# Patient Record
Sex: Male | Born: 1975 | Race: White | Hispanic: No | Marital: Married | Smoking: Current every day smoker
Health system: Southern US, Community
[De-identification: ages and names within clinical notes are randomized; demographics above are authoritative.]

## PROBLEM LIST (undated history)

## (undated) DIAGNOSIS — F419 Anxiety disorder, unspecified: Secondary | ICD-10-CM

## (undated) HISTORY — PX: APPENDECTOMY: SHX54

---

## 2007-03-06 ENCOUNTER — Emergency Department: Payer: Self-pay | Admitting: Emergency Medicine

## 2008-12-10 ENCOUNTER — Emergency Department: Payer: Self-pay | Admitting: Emergency Medicine

## 2013-10-12 ENCOUNTER — Ambulatory Visit: Payer: Self-pay | Admitting: Physician Assistant

## 2015-01-28 DIAGNOSIS — F419 Anxiety disorder, unspecified: Secondary | ICD-10-CM | POA: Insufficient documentation

## 2015-10-17 ENCOUNTER — Ambulatory Visit (INDEPENDENT_AMBULATORY_CARE_PROVIDER_SITE_OTHER): Payer: BLUE CROSS/BLUE SHIELD

## 2015-10-17 ENCOUNTER — Encounter: Payer: Self-pay | Admitting: Emergency Medicine

## 2015-10-17 ENCOUNTER — Ambulatory Visit
Admission: EM | Admit: 2015-10-17 | Discharge: 2015-10-17 | Disposition: A | Discharge status: Home / Self Care | Payer: BLUE CROSS/BLUE SHIELD | Attending: Family Medicine | Admitting: Family Medicine

## 2015-10-17 DIAGNOSIS — M62830 Muscle spasm of back: Secondary | ICD-10-CM | POA: Diagnosis not present

## 2015-10-17 HISTORY — DX: Anxiety disorder, unspecified: F41.9

## 2015-10-17 MED ORDER — KETOROLAC TROMETHAMINE 60 MG/2ML IM SOLN
60.0000 mg | Freq: Once | INTRAMUSCULAR | Status: AC
  Administered 2015-10-17: 60 mg via INTRAMUSCULAR

## 2015-10-17 MED ORDER — HYDROCODONE-ACETAMINOPHEN 10-325 MG PO TABS: 1.0000 | ORAL_TABLET | Freq: Four times a day (QID) | ORAL | Status: AC | PRN

## 2015-10-17 MED ORDER — ORPHENADRINE CITRATE ER 100 MG PO TB12: 100.0000 mg | ORAL_TABLET | Freq: Two times a day (BID) | ORAL | Status: AC

## 2015-10-17 MED ORDER — MELOXICAM 15 MG PO TABS: 15.0000 mg | ORAL_TABLET | Freq: Every day | ORAL | Status: AC
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
## 2015-10-17 NOTE — ED Notes (Signed)
Pt denies hitting head, no burns. Pt also reports numbness and tingling R toes.

## 2015-10-17 NOTE — Discharge Instructions (Signed)
Back Exercises If you have pain in your back, do these exercises 2-3 times each day or as told by your doctor. When the pain goes away, do the exercises once each day, but repeat the steps more times for each exercise (do more repetitions). If you do not have pain in your back, do these exercises once each day or as told by your doctor. EXERCISES Single Knee to Chest Do these steps 3-5 times in a row for each leg:  Lie on your back on a firm bed or the floor with your legs stretched out.  Bring one knee to your chest.  Hold your knee to your chest by grabbing your knee or thigh.  Pull on your knee until you feel a gentle stretch in your lower back.  Keep doing the stretch for 10-30 seconds.  Slowly let go of your leg and straighten it. Pelvic Tilt Do these steps 5-10 times in a row:  Lie on your back on a firm bed or the floor with your legs stretched out.  Bend your knees so they point up to the ceiling. Your feet should be flat on the floor.  Tighten your lower belly (abdomen) muscles to press your lower back against the floor. This will make your tailbone point up to the ceiling instead of pointing down to your feet or the floor.  Stay in this position for 5-10 seconds while you gently tighten your muscles and breathe evenly. Cat-Cow Do these steps until your lower back bends more easily: 1. Get on your hands and knees on a firm surface. Keep your hands under your shoulders, and keep your knees under your hips. You may put padding under your knees. 2. Let your head hang down, and make your tailbone point down to the floor so your lower back is round like the back of a cat. 3. Stay in this position for 5 seconds. 4. Slowly lift your head and make your tailbone point up to the ceiling so your back hangs low (sags) like the back of a cow. 5. Stay in this position for 5 seconds. Press-Ups Do these steps 5-10 times in a row: 1. Lie on your belly (face-down) on the floor. 2. Place  your hands near your head, about shoulder-width apart. 3. While you keep your back relaxed and keep your hips on the floor, slowly straighten your arms to raise the top half of your body and lift your shoulders. Do not use your back muscles. To make yourself more comfortable, you may change where you place your hands. 4. Stay in this position for 5 seconds. 5. Slowly return to lying flat on the floor. Bridges Do these steps 10 times in a row: 1. Lie on your back on a firm surface. 2. Bend your knees so they point up to the ceiling. Your feet should be flat on the floor. 3. Tighten your butt muscles and lift your butt off of the floor until your waist is almost as high as your knees. If you do not feel the muscles working in your butt and the back of your thighs, slide your feet 1-2 inches farther away from your butt. 4. Stay in this position for 3-5 seconds. 5. Slowly lower your butt to the floor, and let your butt muscles relax. If this exercise is too easy, try doing it with your arms crossed over your chest. Belly Crunches Do these steps 5-10 times in a row: 1. Lie on your back on a firm bed  or the floor with your legs stretched out. 2. Bend your knees so they point up to the ceiling. Your feet should be flat on the floor. 3. Cross your arms over your chest. 4. Tip your chin a little bit toward your chest but do not bend your neck. 5. Tighten your belly muscles and slowly raise your chest just enough to lift your shoulder blades a tiny bit off of the floor. 6. Slowly lower your chest and your head to the floor. Back Lifts Do these steps 5-10 times in a row: 1. Lie on your belly (face-down) with your arms at your sides, and rest your forehead on the floor. 2. Tighten the muscles in your legs and your butt. 3. Slowly lift your chest off of the floor while you keep your hips on the floor. Keep the back of your head in line with the curve in your back. Look at the floor while you do  this. 4. Stay in this position for 3-5 seconds. 5. Slowly lower your chest and your face to the floor. GET HELP IF:  Your back pain gets a lot worse when you do an exercise.  Your back pain does not lessen 2 hours after you exercise. If you have any of these problems, stop doing the exercises. Do not do them again unless your doctor says it is okay. GET HELP RIGHT AWAY IF:  You have sudden, very bad back pain. If this happens, stop doing the exercises. Do not do them again unless your doctor says it is okay.   This information is not intended to replace advice given to you by your health care provider. Make sure you discuss any questions you have with your health care provider.   Document Released: 12/06/2010 Document Revised: 07/25/2015 Document Reviewed: 12/28/2014 Elsevier Interactive Patient Education 2016 [employer]  Muscle Cramps and Spasms Muscle cramps and spasms are when muscles tighten by themselves. They usually get better within minutes. Muscle cramps are painful. They are usually stronger and last longer than muscle spasms. Muscle spasms may or may not be painful. They can last a few seconds or much longer. HOME CARE  Drink enough fluid to keep your pee (urine) clear or pale yellow.  Massage, stretch, and relax the muscle.  Use a warm towel, heating pad, or warm shower water on tight muscles.  Place ice on the muscle if it is tender or in pain.  Put ice in a plastic bag.  Place a towel between your skin and the bag.  Leave the ice on for 15-20 minutes, 03-04 times a day.  Only take medicine as told by your doctor. GET HELP RIGHT AWAY IF:  Your cramps or spasms get worse, happen more often, or do not get better with time. MAKE SURE YOU:  Understand these instructions.  Will watch your condition.  Will get help right away if you are not doing well or get worse.   This information is not intended to replace advice given to you by your health care provider.  Make sure you discuss any questions you have with your health care provider.   Document Released: 10/16/2008 Document Revised: 02/28/2013 Document Reviewed: 10/20/2012 Elsevier Interactive Patient Education [employer]
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
## 2015-10-30 ENCOUNTER — Encounter: Payer: Self-pay | Admitting: Internal Medicine

## 2015-10-30 DIAGNOSIS — K219 Gastro-esophageal reflux disease without esophagitis: Secondary | ICD-10-CM | POA: Insufficient documentation

## 2015-10-30 DIAGNOSIS — F17200 Nicotine dependence, unspecified, uncomplicated: Secondary | ICD-10-CM | POA: Insufficient documentation

## 2015-10-30 DIAGNOSIS — E785 Hyperlipidemia, unspecified: Secondary | ICD-10-CM | POA: Insufficient documentation
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
